# Patient Record
Sex: Male | Born: 1980 | Race: White | Hispanic: No | Marital: Married | Smoking: Current every day smoker
Health system: Southern US, Community
[De-identification: ages and names within clinical notes are randomized; demographics above are authoritative.]

---

## 2002-09-03 ENCOUNTER — Inpatient Hospital Stay (HOSPITAL_COMMUNITY): Admission: EM | Admit: 2002-09-03 | Discharge: 2002-09-07 | Payer: Self-pay | Admitting: Psychiatry

## 2004-11-14 ENCOUNTER — Emergency Department (HOSPITAL_COMMUNITY): Admission: EM | Admit: 2004-11-14 | Discharge: 2004-11-14 | Payer: Self-pay | Admitting: Emergency Medicine

## 2005-10-11 IMAGING — CR DG CHEST 2V
3 series · 3 of 3 positions shown · non-contrast
Comparison: none

CLINICAL DATA: Cocaine overdose.
 TWO VIEWS OF THE CHEST ? 11/14/2004:
 No prior studies for comparison.

  The heart size and mediastinal contours are normal. The lungs are clear. The visualized skeleton is unremarkable.

[view not recorded (1 of 3)]
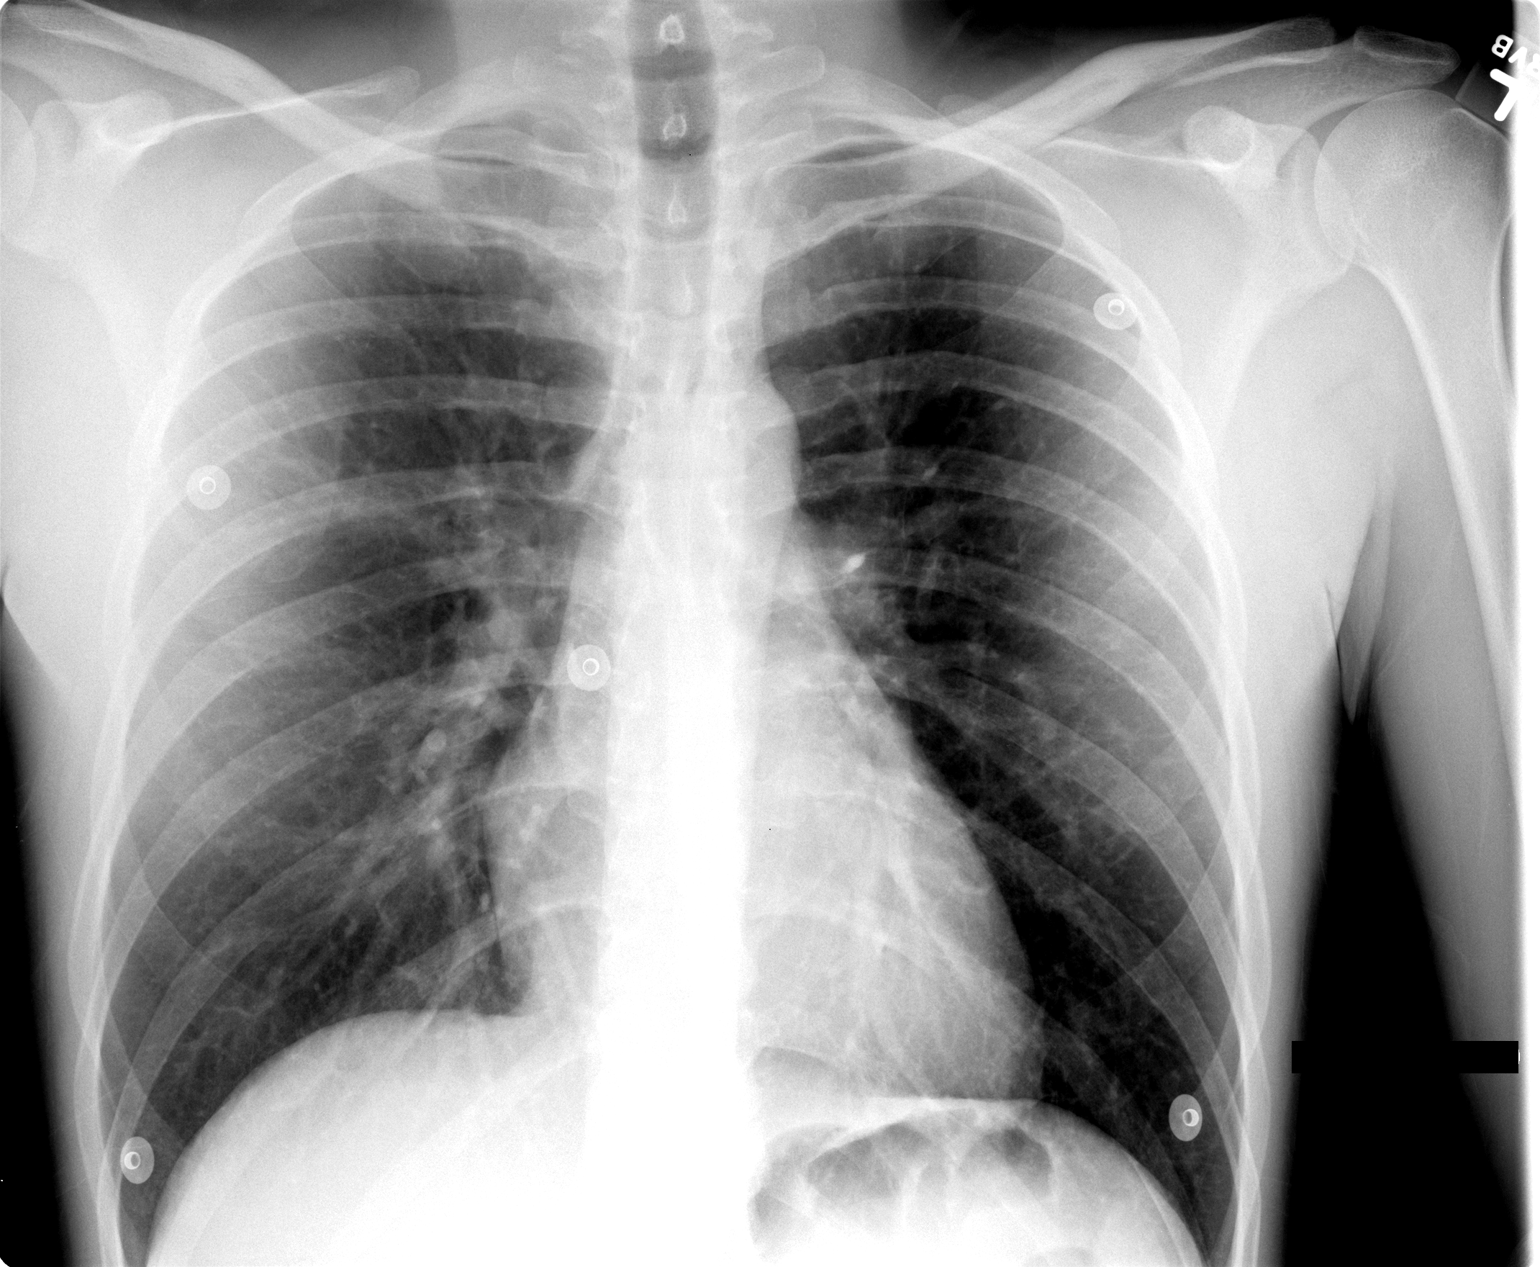

[view not recorded (2 of 3)]
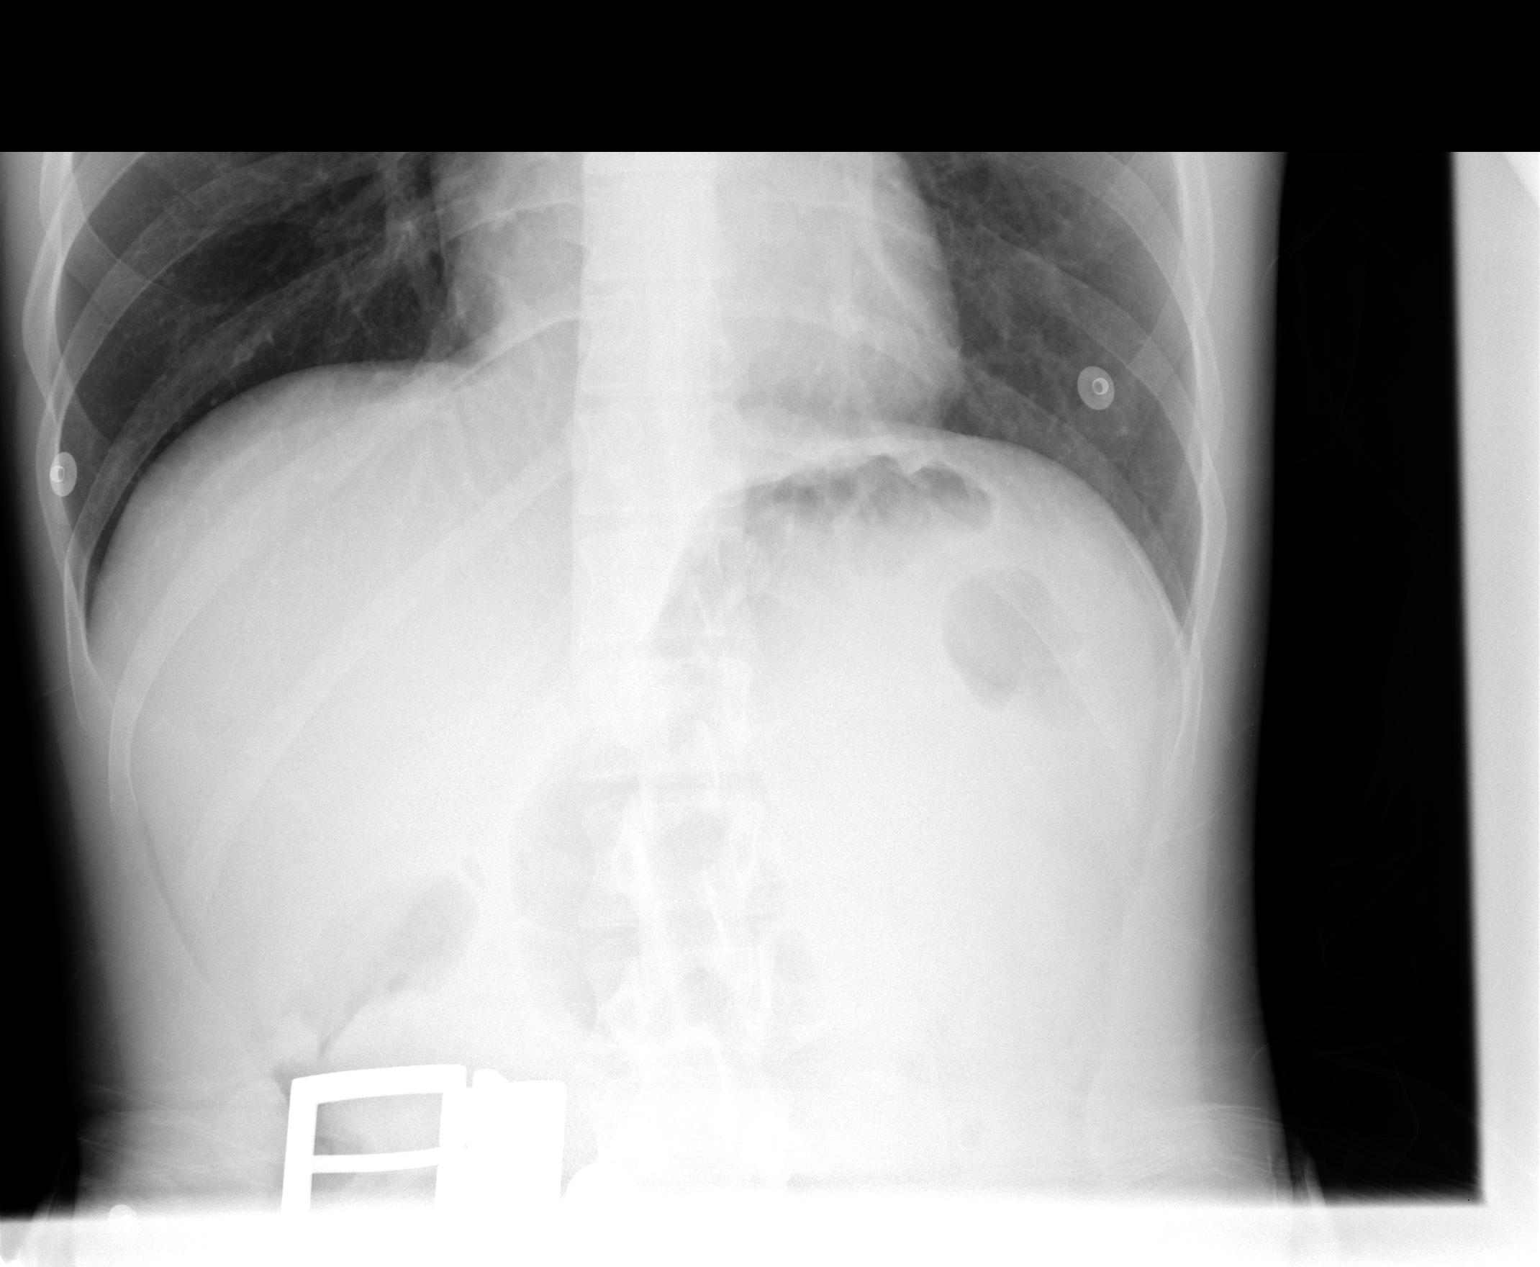

[view not recorded (3 of 3)]
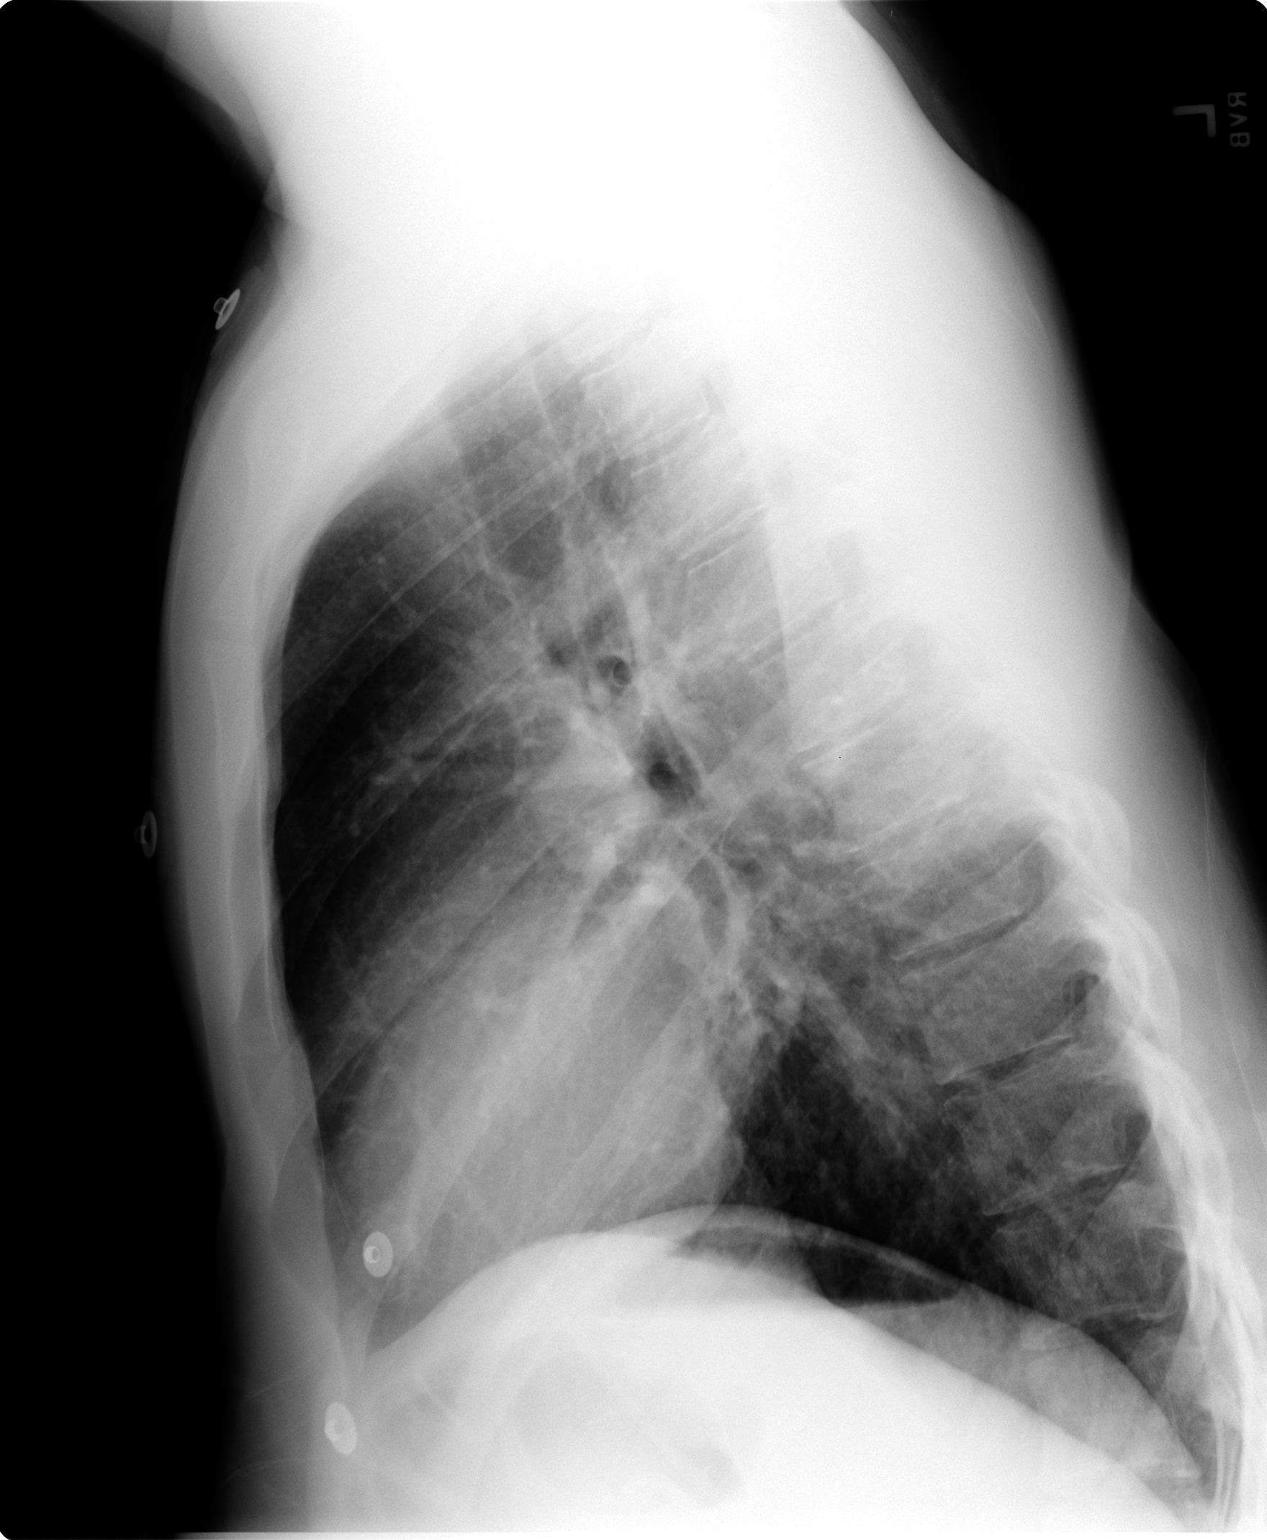

[3 of 3 positions shown; findings below may reference images not displayed]

IMPRESSION: No active disease.

## 2014-03-22 ENCOUNTER — Ambulatory Visit (INDEPENDENT_AMBULATORY_CARE_PROVIDER_SITE_OTHER): Payer: BC Managed Care – PPO | Admitting: Internal Medicine

## 2014-03-22 VITALS — BP 122/80 | HR 102 | Temp 98.6°F | Resp 18 | Ht 72.0 in | Wt 200.0 lb

## 2014-03-22 DIAGNOSIS — J029 Acute pharyngitis, unspecified: Secondary | ICD-10-CM

## 2014-03-22 DIAGNOSIS — K122 Cellulitis and abscess of mouth: Secondary | ICD-10-CM

## 2014-03-22 LAB — POCT RAPID STREP A (OFFICE): RAPID STREP A SCREEN: NEGATIVE

## 2014-03-22 MED ORDER — LIDOCAINE VISCOUS 2 % MT SOLN: OROMUCOSAL | Status: DC

## 2014-03-22 MED ORDER — AMOXICILLIN-POT CLAVULANATE 875-125 MG PO TABS: 1.0000 | ORAL_TABLET | Freq: Two times a day (BID) | ORAL | Status: DC

## 2014-03-22 NOTE — Progress Notes (Signed)
This chart was scribed for Harrel Lemonobert P. , MD by Beverly MilchJ Harrison Collins, ED Scribe. This patient was seen in room 2 and the patient's care was started at 1:44 PM.  Subjective:    Patient ID: Mitchell Mack, male    DOB: Oct 10, 1981, 33 y.o.   MRN: [id number]  Sore Throat  This is a new problem. The current episode started in the past 7 days (Pt reports heavy drinking Saturday evening with associated episode of vomiting "heavy bile" Sunday morning.). The problem has been gradually improving. Neither side of throat is experiencing more pain than the other. There has been no fever. The pain is at a severity of 7/10. The pain is moderate. Associated symptoms include congestion (that has slowly resolved ) and vomiting. Pertinent negatives include no diarrhea or shortness of breath. He has tried cool liquids for the symptoms.  He states his throat has hurt since that episode, and he hasn't slept well in the last 5 days. Pt denies postnasal drip. Pt reports he has a CPAP machine that he doesn't like to use. Pt is a current smoker.    Review of Systems  HENT: Positive for congestion (that has slowly resolved ) and sore throat. Negative for postnasal drip and rhinorrhea.   Respiratory: Negative for shortness of breath.   Gastrointestinal: Positive for nausea and vomiting. Negative for diarrhea.  All other systems reviewed and are negative.      Objective:   Physical Exam  Nursing note and vitals reviewed. Constitutional: He is oriented to person, place, and time. He appears well-developed and well-nourished. No distress.  HENT:  Head: Normocephalic and atraumatic.  Right Ear: External ear normal.  Left Ear: External ear normal.  Mouth/Throat: Uvula is midline. Uvula swelling present.  The uvula is swollen to twice normal size and is cherry red. Tonsils only 1+ without exudate  Eyes: Conjunctivae and EOM are normal. Pupils are equal, round, and reactive to light. Right eye exhibits no discharge. Left eye  exhibits no discharge.  Neck: Normal range of motion. Neck supple. No thyromegaly present.  Pulmonary/Chest: Effort normal and breath sounds normal. He has no wheezes.  Lymphadenopathy:    He has no cervical adenopathy.  Neurological: He is alert and oriented to person, place, and time.  Skin: Skin is warm and dry.  Psychiatric: He has a normal mood and affect. His behavior is normal.   Filed Vitals:   03/22/14 1340  BP: 122/80  Pulse: 102  Temp: 98.6 F (37 C)  TempSrc: Oral  Resp: 18  Height: 6' (1.829 m)  Weight: 200 lb (90.719 kg)  SpO2: 98%   Results for orders placed in visit on 03/22/14  POCT RAPID STREP A (OFFICE)      Result Value Ref Range   Rapid Strep A Screen Negative  Negative       Assessment & Plan:  DIAGNOSTIC STUDIES: Oxygen Saturation is 98% on RA, normal by my interpretation.    COORDINATION OF CARE: Pt advised of plan for treatment and pt agrees.  Uvulitis  Sore throat - Plan: POCT rapid strep A, Culture, Group A Strep   Meds ordered this encounter  Medications  . amoxicillin-clavulanate (AUGMENTIN) 875-125 MG per tablet    Sig: Take 1 tablet by mouth 2 (two) times daily.    Dispense:  20 tablet    Refill:  0  . lidocaine (XYLOCAINE) 2 % solution    Sig: Gargle and spit or swallow with 1 tsp q 2-4 hrs  as needed    Dispense:  100 mL    Refill:  0      I have completed the patient encounter in its entirety as documented by the scribe, with editing by me where necessary. Robert P. Merla Richesoolittle, M.D.

## 2014-03-24 LAB — CULTURE, GROUP A STREP: Organism ID, Bacteria: NORMAL

## 2014-03-25 ENCOUNTER — Telehealth: Payer: Self-pay | Admitting: *Deleted

## 2014-03-25 NOTE — Telephone Encounter (Signed)
Finish augmentin and if not well --to ent

## 2014-03-25 NOTE — Telephone Encounter (Signed)
Pt notified with lab results. However, he states that his throat pain isn;t any better since starting the antibiotic on Friday. He states this has been going on for 8 days. Do you have any recommendations for him at this point? Please advise.

## 2014-03-26 NOTE — Telephone Encounter (Signed)
LMVM to CB. 

## 2014-03-27 NOTE — Telephone Encounter (Signed)
Pt notified and is agreeable. 

## 2018-08-28 ENCOUNTER — Ambulatory Visit: Payer: BC Managed Care – PPO | Admitting: Emergency Medicine

## 2018-08-28 ENCOUNTER — Other Ambulatory Visit: Payer: Self-pay

## 2018-08-28 ENCOUNTER — Encounter: Payer: Self-pay | Admitting: Emergency Medicine

## 2018-08-28 VITALS — BP 134/67 | HR 60 | Temp 98.8°F | Resp 16 | Ht 72.0 in | Wt 180.6 lb

## 2018-08-28 DIAGNOSIS — L237 Allergic contact dermatitis due to plants, except food: Secondary | ICD-10-CM

## 2018-08-28 DIAGNOSIS — L299 Pruritus, unspecified: Secondary | ICD-10-CM

## 2018-08-28 MED ORDER — PREDNISONE 20 MG PO TABS: 40.0000 mg | ORAL_TABLET | Freq: Every day | ORAL | 0 refills | Status: AC

## 2018-08-28 MED ORDER — METHYLPREDNISOLONE SODIUM SUCC 125 MG IJ SOLR
125.0000 mg | Freq: Once | INTRAMUSCULAR | Status: AC
  Administered 2018-08-28: 125 mg via INTRAMUSCULAR

## 2018-08-28 NOTE — Progress Notes (Signed)
Mitchell PeopleJason Demauro 37 y.o.   Chief Complaint  Patient presents with  . Poison Ivy    x 2 days on the right side of face and left arm    HISTORY OF PRESENT ILLNESS: This is a 37 y.o. male complaining of poison ivy exposure 2 days ago with persistent itching to right side of the face and left arm.  No other significant symptoms.  HPI   Prior to Admission medications   Not on File    No Known Allergies  There are no active problems to display for this patient.   No past medical history on file.  No past surgical history on file.  Social History   Socioeconomic History  . Marital status: Married    Spouse name: Not on file  . Number of children: Not on file  . Years of education: Not on file  . Highest education level: Not on file  Occupational History  . Not on file  Social Needs  . Financial resource strain: Not on file  . Food insecurity:    Worry: Not on file    Inability: Not on file  . Transportation needs:    Medical: Not on file    Non-medical: Not on file  Tobacco Use  . Smoking status: Current Every Day Smoker    Packs/day: 0.25    Types: Cigarettes  . Smokeless tobacco: Never Used  Substance and Sexual Activity  . Alcohol use: Yes  . Drug use: No  . Sexual activity: Not on file  Lifestyle  . Physical activity:    Days per week: Not on file    Minutes per session: Not on file  . Stress: Not on file  Relationships  . Social connections:    Talks on phone: Not on file    Gets together: Not on file    Attends religious service: Not on file    Active member of club or organization: Not on file    Attends meetings of clubs or organizations: Not on file    Relationship status: Not on file  . Intimate partner violence:    Fear of current or ex partner: Not on file    Emotionally abused: Not on file    Physically abused: Not on file    Forced sexual activity: Not on file  Other Topics Concern  . Not on file  Social History Narrative  . Not on file     Family History  Problem Relation Age of Onset  . Cancer Maternal Grandmother      Review of Systems  Constitutional: Negative.  Negative for chills and fever.  HENT: Negative.  Negative for sore throat.   Eyes: Negative.  Negative for blurred vision and double vision.  Respiratory: Negative.  Negative for cough and shortness of breath.   Cardiovascular: Negative.  Negative for chest pain and palpitations.  Gastrointestinal: Negative.  Negative for abdominal pain, diarrhea, nausea and vomiting.  Genitourinary: Negative.  Negative for dysuria and urgency.  Musculoskeletal: Negative.  Negative for back pain, myalgias and neck pain.  Skin: Positive for itching and rash.  Neurological: Negative.  Negative for dizziness and headaches.  Endo/Heme/Allergies: Negative.   All other systems reviewed and are negative.  Vitals:   08/28/18 1402  BP: 134/67  Pulse: 60  Resp: 16  Temp: 98.8 F (37.1 C)  SpO2: 97%     Physical Exam  Constitutional: He is oriented to person, place, and time. He appears well-developed and well-nourished.  HENT:  Head: Normocephalic and atraumatic.  Eyes: Pupils are equal, round, and reactive to light. EOM are normal.  Neck: Normal range of motion. Neck supple.  Cardiovascular: Normal rate and regular rhythm.  Pulmonary/Chest: Effort normal and breath sounds normal.  Musculoskeletal: Normal range of motion.  Neurological: He is alert and oriented to person, place, and time.  Skin: Skin is warm and dry. Capillary refill takes less than 2 seconds. Rash noted.  Poison ivy rash to right side of the face and neck.  Also seen on the left arm.  Psychiatric: He has a normal mood and affect. His behavior is normal.  Vitals reviewed.    ASSESSMENT & PLAN: Kelechi was seen today for poison ivy.  Diagnoses and all orders for this visit:  Poison ivy dermatitis -     methylPREDNISolone sodium succinate (SOLU-MEDROL) 125 mg/2 mL injection 125 mg -      predniSONE (DELTASONE) 20 MG tablet; Take 2 tablets (40 mg total) by mouth daily with breakfast for 5 days.  Itching    Patient Instructions       If you have lab work done today you will be contacted with your lab results within the next 2 weeks.  If you have not heard from [place] then please contact us. The fastest way to get your results is to register for My Chart.   IF you received an x-ray today, you will receive an invoice from [facility]. Please contact [facility] at [phone] with questions or concerns regarding your invoice.   IF you received labwork today, you will receive an invoice from [facility]. Please contact [facility] at [phone] with questions or concerns regarding your invoice.   Our billing staff will not be able to assist you with questions regarding bills from these companies.  You will be contacted with the lab results as soon as they are available. The fastest way to get your results is to activate your My Chart account. Instructions are located on the last page of this paperwork. If you have not heard from [place] regarding the results in 2 weeks, please contact this office.     Poison Ivy Dermatitis Poison ivy dermatitis is redness and soreness (inflammation) of the skin. It is caused by a chemical that is found on the leaves of the poison ivy plant. You may also have itching, a rash, and blisters. Symptoms often clear up in 1-2 weeks. You may get this condition by touching a poison ivy plant. You can also get it by touching something that has the chemical on it. This may include animals or objects that have come in contact with the plant. Follow these instructions at home: General instructions  Take or apply over-the-counter and prescription medicines only as told by your doctor.  If you touch poison ivy, wash your skin with soap and cold water right away.  Use hydrocortisone creams or calamine lotion as needed to help with  itching.  Take oatmeal baths as needed. Use colloidal oatmeal. You can get this at a pharmacy or grocery store. Follow the instructions on the package.  Do not scratch or rub your skin.  While you have the rash, wash your clothes right after you wear them. Prevention  Know what poison ivy looks like so you can avoid it. This plant has three leaves with flowering branches on a single stem. The leaves are glossy. They have uneven edges that come to a point at the front.  If you have touched poison ivy, wash with  soap and water right away. Be sure to wash under your fingernails.  When hiking or camping, wear long pants, a long-sleeved shirt, tall socks, and hiking boots. You can also use a lotion on your skin that helps to prevent contact with the chemical on the plant.  If you think that your clothes or outdoor gear came in contact with poison ivy, rinse them off with a garden hose before you bring them inside your house. Contact a doctor if:  You have open sores in the rash area.  You have more redness, swelling, or pain in the affected area.  You have redness that spreads beyond the rash area.  You have fluid, blood, or pus coming from the affected area.  You have a fever.  You have a rash over a large area of your body.  You have a rash on your eyes, mouth, or genitals.  Your rash does not get better after a few days. Get help right away if:  Your face swells or your eyes swell shut.  You have trouble breathing.  You have trouble swallowing. This information is not intended to replace advice given to you by your health care provider. Make sure you discuss any questions you have with your health care provider. Document Released: 12/25/2010 Document Revised: 04/29/2016 Document Reviewed: 04/30/2015 Elsevier Interactive Patient Education  2018 [employer].      Edwina Barth, MD [facility]

## 2018-08-28 NOTE — Patient Instructions (Addendum)
   If you have lab work done today you will be contacted with your lab results within the next 2 weeks.  If you have not heard from us then please contact us. The fastest way to get your results is to register for My Chart.   IF you received an x-ray today, you will receive an invoice from [facility]. Please contact [facility] at [phone] with questions or concerns regarding your invoice.   IF you received labwork today, you will receive an invoice from [facility]. Please contact [facility] at [phone] with questions or concerns regarding your invoice.   Our billing staff will not be able to assist you with questions regarding bills from these companies.  You will be contacted with the lab results as soon as they are available. The fastest way to get your results is to activate your My Chart account. Instructions are located on the last page of this paperwork. If you have not heard from us regarding the results in 2 weeks, please contact this office.     Poison Ivy Dermatitis Poison ivy dermatitis is redness and soreness (inflammation) of the skin. It is caused by a chemical that is found on the leaves of the poison ivy plant. You may also have itching, a rash, and blisters. Symptoms often clear up in 1-2 weeks. You may get this condition by touching a poison ivy plant. You can also get it by touching something that has the chemical on it. This may include animals or objects that have come in contact with the plant. Follow these instructions at home: General instructions  Take or apply over-the-counter and prescription medicines only as told by your doctor.  If you touch poison ivy, wash your skin with soap and cold water right away.  Use hydrocortisone creams or calamine lotion as needed to help with itching.  Take oatmeal baths as needed. Use colloidal oatmeal. You can get this at a pharmacy or grocery store. Follow the instructions on the package.  Do not  scratch or rub your skin.  While you have the rash, wash your clothes right after you wear them. Prevention  Know what poison ivy looks like so you can avoid it. This plant has three leaves with flowering branches on a single stem. The leaves are glossy. They have uneven edges that come to a point at the front.  If you have touched poison ivy, wash with soap and water right away. Be sure to wash under your fingernails.  When hiking or camping, wear long pants, a long-sleeved shirt, tall socks, and hiking boots. You can also use a lotion on your skin that helps to prevent contact with the chemical on the plant.  If you think that your clothes or outdoor gear came in contact with poison ivy, rinse them off with a garden hose before you bring them inside your house. Contact a doctor if:  You have open sores in the rash area.  You have more redness, swelling, or pain in the affected area.  You have redness that spreads beyond the rash area.  You have fluid, blood, or pus coming from the affected area.  You have a fever.  You have a rash over a large area of your body.  You have a rash on your eyes, mouth, or genitals.  Your rash does not get better after a few days. Get help right away if:  Your face swells or your eyes swell shut.  You have trouble breathing.    You have trouble swallowing. This information is not intended to replace advice given to you by your health care provider. Make sure you discuss any questions you have with your health care provider. Document Released: 12/25/2010 Document Revised: 04/29/2016 Document Reviewed: 04/30/2015 Elsevier Interactive Patient Education  2018 [employer]
